# Patient Record
Sex: Male | Born: 1985 | Race: Black or African American | Hispanic: No | Marital: Single | Smoking: Never smoker
Health system: Southern US, Community
[De-identification: ages and names within clinical notes are randomized; demographics above are authoritative.]

## PROBLEM LIST (undated history)

## (undated) DIAGNOSIS — E7800 Pure hypercholesterolemia, unspecified: Secondary | ICD-10-CM

## (undated) DIAGNOSIS — W3400XA Accidental discharge from unspecified firearms or gun, initial encounter: Secondary | ICD-10-CM

## (undated) DIAGNOSIS — F419 Anxiety disorder, unspecified: Secondary | ICD-10-CM

## (undated) DIAGNOSIS — F17200 Nicotine dependence, unspecified, uncomplicated: Secondary | ICD-10-CM

## (undated) DIAGNOSIS — T7840XA Allergy, unspecified, initial encounter: Secondary | ICD-10-CM

## (undated) HISTORY — DX: Nicotine dependence, unspecified, uncomplicated: F17.200

## (undated) HISTORY — DX: Allergy, unspecified, initial encounter: T78.40XA

## (undated) HISTORY — PX: LEG SURGERY: SHX1003

---
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
## 2010-08-30 DIAGNOSIS — R109 Unspecified abdominal pain: Secondary | ICD-10-CM | POA: Insufficient documentation

## 2010-10-06 DIAGNOSIS — R0602 Shortness of breath: Secondary | ICD-10-CM | POA: Insufficient documentation

## 2010-10-06 DIAGNOSIS — R197 Diarrhea, unspecified: Secondary | ICD-10-CM | POA: Insufficient documentation

## 2010-10-06 DIAGNOSIS — R079 Chest pain, unspecified: Secondary | ICD-10-CM | POA: Insufficient documentation

## 2010-10-06 DIAGNOSIS — R634 Abnormal weight loss: Secondary | ICD-10-CM | POA: Insufficient documentation

## 2010-10-06 DIAGNOSIS — R630 Anorexia: Secondary | ICD-10-CM | POA: Insufficient documentation

## 2010-10-06 DIAGNOSIS — R509 Fever, unspecified: Secondary | ICD-10-CM | POA: Insufficient documentation

## 2010-10-06 DIAGNOSIS — R109 Unspecified abdominal pain: Secondary | ICD-10-CM | POA: Insufficient documentation

## 2010-10-06 DIAGNOSIS — R112 Nausea with vomiting, unspecified: Secondary | ICD-10-CM | POA: Insufficient documentation

## 2014-07-11 ENCOUNTER — Emergency Department (HOSPITAL_COMMUNITY)
Admission: EM | Admit: 2014-07-11 | Discharge: 2014-07-11 | Discharge status: Against Medical Advice | Payer: Medicaid Other | Attending: Emergency Medicine | Admitting: Emergency Medicine

## 2014-07-11 DIAGNOSIS — R5383 Other fatigue: Secondary | ICD-10-CM | POA: Diagnosis present
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
## 2014-07-11 NOTE — ED Notes (Signed)
Pt called to triage x3, no answer.

## 2014-07-11 NOTE — ED Notes (Addendum)
Pt called for triage x1! No answer °
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
## 2014-07-11 NOTE — ED Notes (Signed)
Pt called for triage x2, no answer. 

## 2017-03-09 ENCOUNTER — Emergency Department (HOSPITAL_COMMUNITY): Payer: Medicaid Other

## 2017-03-09 ENCOUNTER — Inpatient Hospital Stay (HOSPITAL_COMMUNITY)
Admission: EM | Admit: 2017-03-09 | Discharge: 2017-03-11 | DRG: 176 | Disposition: A | Discharge status: Home / Self Care | Payer: Medicaid Other | Attending: Family Medicine | Admitting: Family Medicine

## 2017-03-09 ENCOUNTER — Encounter (HOSPITAL_COMMUNITY): Payer: Self-pay | Admitting: Emergency Medicine

## 2017-03-09 DIAGNOSIS — N179 Acute kidney failure, unspecified: Secondary | ICD-10-CM | POA: Diagnosis present

## 2017-03-09 DIAGNOSIS — F1410 Cocaine abuse, uncomplicated: Secondary | ICD-10-CM | POA: Diagnosis present

## 2017-03-09 DIAGNOSIS — R042 Hemoptysis: Secondary | ICD-10-CM

## 2017-03-09 DIAGNOSIS — I2699 Other pulmonary embolism without acute cor pulmonale: Principal | ICD-10-CM | POA: Diagnosis present

## 2017-03-09 DIAGNOSIS — W3301XD Accidental discharge of shotgun, subsequent encounter: Secondary | ICD-10-CM

## 2017-03-09 DIAGNOSIS — F1090 Alcohol use, unspecified, uncomplicated: Secondary | ICD-10-CM

## 2017-03-09 DIAGNOSIS — S71131D Puncture wound without foreign body, right thigh, subsequent encounter: Secondary | ICD-10-CM

## 2017-03-09 DIAGNOSIS — Z7289 Other problems related to lifestyle: Secondary | ICD-10-CM

## 2017-03-09 DIAGNOSIS — F329 Major depressive disorder, single episode, unspecified: Secondary | ICD-10-CM | POA: Diagnosis present

## 2017-03-09 DIAGNOSIS — F17200 Nicotine dependence, unspecified, uncomplicated: Secondary | ICD-10-CM

## 2017-03-09 DIAGNOSIS — F1210 Cannabis abuse, uncomplicated: Secondary | ICD-10-CM | POA: Diagnosis present

## 2017-03-09 DIAGNOSIS — F17210 Nicotine dependence, cigarettes, uncomplicated: Secondary | ICD-10-CM | POA: Diagnosis present

## 2017-03-09 DIAGNOSIS — F1490 Cocaine use, unspecified, uncomplicated: Secondary | ICD-10-CM

## 2017-03-09 DIAGNOSIS — Z789 Other specified health status: Secondary | ICD-10-CM

## 2017-03-09 DIAGNOSIS — M21371 Foot drop, right foot: Secondary | ICD-10-CM | POA: Diagnosis present

## 2017-03-09 HISTORY — DX: Accidental discharge from unspecified firearms or gun, initial encounter: W34.00XA

## 2017-03-09 LAB — I-STAT TROPONIN, ED: Troponin i, poc: 0 ng/mL (ref 0.00–0.08)

## 2017-03-09 LAB — RAPID URINE DRUG SCREEN, HOSP PERFORMED
AMPHETAMINES: NOT DETECTED
BENZODIAZEPINES: POSITIVE — AB
Barbiturates: NOT DETECTED
Cocaine: POSITIVE — AB
Opiates: NOT DETECTED
Tetrahydrocannabinol: POSITIVE — AB

## 2017-03-09 LAB — CBC
HEMATOCRIT: 43.4 % (ref 39.0–52.0)
Hemoglobin: 15.7 g/dL (ref 13.0–17.0)
MCH: 29.2 pg (ref 26.0–34.0)
MCHC: 36.2 g/dL — AB (ref 30.0–36.0)
MCV: 80.8 fL (ref 78.0–100.0)
Platelets: 303 10*3/uL (ref 150–400)
RBC: 5.37 MIL/uL (ref 4.22–5.81)
RDW: 13.5 % (ref 11.5–15.5)
WBC: 11.1 10*3/uL — ABNORMAL HIGH (ref 4.0–10.5)

## 2017-03-09 LAB — BASIC METABOLIC PANEL
ANION GAP: 9 (ref 5–15)
BUN: 5 mg/dL — ABNORMAL LOW (ref 6–20)
CHLORIDE: 101 mmol/L (ref 101–111)
CO2: 26 mmol/L (ref 22–32)
Calcium: 9.7 mg/dL (ref 8.9–10.3)
Creatinine, Ser: 1.3 mg/dL — ABNORMAL HIGH (ref 0.61–1.24)
GFR calc Af Amer: >60 mL/min (ref >60)
GLUCOSE: 116 mg/dL — AB (ref 65–99)
POTASSIUM: 3.9 mmol/L (ref 3.5–5.1)
Sodium: 136 mmol/L (ref 135–145)

## 2017-03-09 LAB — ETHANOL

## 2017-03-09 MED ORDER — ENOXAPARIN SODIUM 80 MG/0.8ML ~~LOC~~ SOLN
1.0000 mg/kg | Freq: Two times a day (BID) | SUBCUTANEOUS | Status: DC
  Administered 2017-03-09 – 2017-03-10 (×2): 75 mg via SUBCUTANEOUS
  Filled 2017-03-09 (×3): qty 0.8

## 2017-03-09 MED ORDER — VITAMIN B-1 100 MG PO TABS
100.0000 mg | ORAL_TABLET | Freq: Every day | ORAL | Status: DC
  Administered 2017-03-09 – 2017-03-11 (×3): 100 mg via ORAL
  Filled 2017-03-09 (×3): qty 1

## 2017-03-09 MED ORDER — NICOTINE 21 MG/24HR TD PT24
21.0000 mg | MEDICATED_PATCH | Freq: Every day | TRANSDERMAL | Status: DC
  Administered 2017-03-09 – 2017-03-11 (×3): 21 mg via TRANSDERMAL
  Filled 2017-03-09 (×3): qty 1

## 2017-03-09 MED ORDER — KETOROLAC TROMETHAMINE 30 MG/ML IJ SOLN
30.0000 mg | Freq: Four times a day (QID) | INTRAMUSCULAR | Status: DC | PRN
Start: 2017-03-09 — End: 2017-03-11
  Administered 2017-03-10: 30 mg via INTRAVENOUS
  Filled 2017-03-09: qty 1

## 2017-03-09 MED ORDER — POLYETHYLENE GLYCOL 3350 17 G PO PACK: 17.0000 g | PACK | Freq: Every day | ORAL | Status: DC | PRN

## 2017-03-09 MED ORDER — SODIUM CHLORIDE 0.9% FLUSH: 3.0000 mL | INTRAVENOUS | Status: DC | PRN

## 2017-03-09 MED ORDER — FENTANYL CITRATE (PF) 100 MCG/2ML IJ SOLN: 50.0000 ug | Freq: Once | INTRAMUSCULAR | Status: DC

## 2017-03-09 MED ORDER — IOPAMIDOL (ISOVUE-370) INJECTION 76%
INTRAVENOUS | Status: AC
  Filled 2017-03-09: qty 100

## 2017-03-09 MED ORDER — SODIUM CHLORIDE 0.9% FLUSH
3.0000 mL | Freq: Two times a day (BID) | INTRAVENOUS | Status: DC
  Administered 2017-03-09 – 2017-03-11 (×4): 3 mL via INTRAVENOUS

## 2017-03-09 MED ORDER — SODIUM CHLORIDE 0.9 % IV SOLN: 250.0000 mL | INTRAVENOUS | Status: DC | PRN

## 2017-03-09 MED ORDER — ONDANSETRON HCL 4 MG PO TABS: 4.0000 mg | ORAL_TABLET | Freq: Four times a day (QID) | ORAL | Status: DC | PRN

## 2017-03-09 MED ORDER — OXYCODONE-ACETAMINOPHEN 5-325 MG PO TABS
2.0000 | ORAL_TABLET | ORAL | Status: DC | PRN
  Administered 2017-03-09: 2 via ORAL
  Filled 2017-03-09: qty 2

## 2017-03-09 MED ORDER — SODIUM CHLORIDE 0.9 % IV BOLUS (SEPSIS)
1000.0000 mL | Freq: Once | INTRAVENOUS | Status: AC
  Administered 2017-03-09: 1000 mL via INTRAVENOUS

## 2017-03-09 MED ORDER — ONDANSETRON HCL 4 MG/2ML IJ SOLN: 4.0000 mg | Freq: Four times a day (QID) | INTRAMUSCULAR | Status: DC | PRN

## 2017-03-09 MED ORDER — FOLIC ACID 1 MG PO TABS
1.0000 mg | ORAL_TABLET | Freq: Every day | ORAL | Status: DC
  Administered 2017-03-09 – 2017-03-11 (×3): 1 mg via ORAL
  Filled 2017-03-09 (×3): qty 1

## 2017-03-09 NOTE — ED Notes (Addendum)
Admitting provider asked that UDS be completed prior to pain medication administration.

## 2017-03-09 NOTE — ED Notes (Signed)
Patient transported to X-ray 
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
## 2017-03-09 NOTE — ED Triage Notes (Signed)
Patient reports SOB with chest tightness radiating to mid back onset this week .

## 2017-03-09 NOTE — ED Notes (Signed)
Patient transported to CT 
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
## 2017-03-10 ENCOUNTER — Observation Stay (HOSPITAL_BASED_OUTPATIENT_CLINIC_OR_DEPARTMENT_OTHER): Payer: Medicaid Other

## 2017-03-10 ENCOUNTER — Encounter (HOSPITAL_COMMUNITY): Payer: Self-pay | Admitting: *Deleted

## 2017-03-10 DIAGNOSIS — F1090 Alcohol use, unspecified, uncomplicated: Secondary | ICD-10-CM

## 2017-03-10 DIAGNOSIS — F17200 Nicotine dependence, unspecified, uncomplicated: Secondary | ICD-10-CM

## 2017-03-10 DIAGNOSIS — R042 Hemoptysis: Secondary | ICD-10-CM

## 2017-03-10 DIAGNOSIS — F1490 Cocaine use, unspecified, uncomplicated: Secondary | ICD-10-CM | POA: Diagnosis not present

## 2017-03-10 DIAGNOSIS — Z7289 Other problems related to lifestyle: Secondary | ICD-10-CM

## 2017-03-10 DIAGNOSIS — I2699 Other pulmonary embolism without acute cor pulmonale: Secondary | ICD-10-CM | POA: Diagnosis not present

## 2017-03-10 DIAGNOSIS — I361 Nonrheumatic tricuspid (valve) insufficiency: Secondary | ICD-10-CM

## 2017-03-10 DIAGNOSIS — Z789 Other specified health status: Secondary | ICD-10-CM | POA: Diagnosis not present

## 2017-03-10 LAB — BASIC METABOLIC PANEL
Anion gap: 10 (ref 5–15)
Anion gap: 9 (ref 5–15)
BUN: 5 mg/dL — AB (ref 6–20)
BUN: 7 mg/dL (ref 6–20)
CALCIUM: 9.3 mg/dL (ref 8.9–10.3)
CALCIUM: 8.9 mg/dL (ref 8.9–10.3)
CHLORIDE: 99 mmol/L — AB (ref 101–111)
CO2: 27 mmol/L (ref 22–32)
CO2: 26 mmol/L (ref 22–32)
CREATININE: 1.21 mg/dL (ref 0.61–1.24)
CREATININE: 1.26 mg/dL — AB (ref 0.61–1.24)
Chloride: 98 mmol/L — ABNORMAL LOW (ref 101–111)
GFR calc Af Amer: >60 mL/min (ref >60)
GFR calc non Af Amer: >60 mL/min (ref >60)
GFR calc non Af Amer: >60 mL/min (ref >60)
Glucose, Bld: 96 mg/dL (ref 65–99)
Glucose, Bld: 131 mg/dL — ABNORMAL HIGH (ref 65–99)
Potassium: 4 mmol/L (ref 3.5–5.1)
Potassium: 3.6 mmol/L (ref 3.5–5.1)
SODIUM: 133 mmol/L — AB (ref 135–145)
Sodium: 136 mmol/L (ref 135–145)

## 2017-03-10 LAB — ECHOCARDIOGRAM COMPLETE
Height: 72 in
WEIGHTICAEL: 2657.6 [oz_av]

## 2017-03-10 LAB — CBC
HCT: 42 % (ref 39.0–52.0)
Hemoglobin: 14.5 g/dL (ref 13.0–17.0)
MCH: 28.4 pg (ref 26.0–34.0)
MCHC: 34.5 g/dL (ref 30.0–36.0)
MCV: 82.4 fL (ref 78.0–100.0)
PLATELETS: 326 10*3/uL (ref 150–400)
RBC: 5.1 MIL/uL (ref 4.22–5.81)
RDW: 13.6 % (ref 11.5–15.5)
WBC: 11.9 10*3/uL — ABNORMAL HIGH (ref 4.0–10.5)

## 2017-03-10 LAB — HIV ANTIBODY (ROUTINE TESTING W REFLEX): HIV Screen 4th Generation wRfx: NONREACTIVE

## 2017-03-10 MED ORDER — RIVAROXABAN 15 MG PO TABS
15.0000 mg | ORAL_TABLET | Freq: Two times a day (BID) | ORAL | Status: DC
  Administered 2017-03-10 – 2017-03-11 (×2): 15 mg via ORAL
  Filled 2017-03-10 (×2): qty 1

## 2017-03-10 MED ORDER — ACETAMINOPHEN 325 MG PO TABS
650.0000 mg | ORAL_TABLET | Freq: Four times a day (QID) | ORAL | Status: DC | PRN
  Administered 2017-03-10: 650 mg via ORAL
  Filled 2017-03-10: qty 2
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
## 2017-03-10 NOTE — Progress Notes (Signed)
Patient coughed up blood, about a quarter size in a cup of water. Will continue to monitor.
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
## 2017-03-10 NOTE — Progress Notes (Signed)
Patient doing well. Tolerated physical and occupational therapy. Visitors at the bedside at this time.
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
## 2017-03-11 DIAGNOSIS — I2699 Other pulmonary embolism without acute cor pulmonale: Secondary | ICD-10-CM | POA: Diagnosis present

## 2017-03-11 DIAGNOSIS — W3301XD Accidental discharge of shotgun, subsequent encounter: Secondary | ICD-10-CM | POA: Diagnosis not present

## 2017-03-11 DIAGNOSIS — N179 Acute kidney failure, unspecified: Secondary | ICD-10-CM | POA: Diagnosis present

## 2017-03-11 DIAGNOSIS — R042 Hemoptysis: Secondary | ICD-10-CM | POA: Diagnosis present

## 2017-03-11 DIAGNOSIS — F1210 Cannabis abuse, uncomplicated: Secondary | ICD-10-CM | POA: Diagnosis present

## 2017-03-11 DIAGNOSIS — F17200 Nicotine dependence, unspecified, uncomplicated: Secondary | ICD-10-CM | POA: Diagnosis not present

## 2017-03-11 DIAGNOSIS — R0602 Shortness of breath: Secondary | ICD-10-CM | POA: Diagnosis present

## 2017-03-11 DIAGNOSIS — F17210 Nicotine dependence, cigarettes, uncomplicated: Secondary | ICD-10-CM | POA: Diagnosis present

## 2017-03-11 DIAGNOSIS — M21371 Foot drop, right foot: Secondary | ICD-10-CM | POA: Diagnosis present

## 2017-03-11 DIAGNOSIS — F1410 Cocaine abuse, uncomplicated: Secondary | ICD-10-CM | POA: Diagnosis present

## 2017-03-11 DIAGNOSIS — F329 Major depressive disorder, single episode, unspecified: Secondary | ICD-10-CM | POA: Diagnosis present

## 2017-03-11 DIAGNOSIS — S71131D Puncture wound without foreign body, right thigh, subsequent encounter: Secondary | ICD-10-CM | POA: Diagnosis not present

## 2017-03-11 HISTORY — DX: Other pulmonary embolism without acute cor pulmonale: I26.99

## 2017-03-11 LAB — CBC
HCT: 37.6 % — ABNORMAL LOW (ref 39.0–52.0)
HEMOGLOBIN: 12.9 g/dL — AB (ref 13.0–17.0)
MCH: 27.7 pg (ref 26.0–34.0)
MCHC: 34.3 g/dL (ref 30.0–36.0)
MCV: 80.7 fL (ref 78.0–100.0)
Platelets: 308 10*3/uL (ref 150–400)
RBC: 4.66 MIL/uL (ref 4.22–5.81)
RDW: 13.2 % (ref 11.5–15.5)
WBC: 9.5 10*3/uL (ref 4.0–10.5)

## 2017-03-11 MED ORDER — FLUOXETINE HCL 20 MG PO CAPS: 20.0000 mg | ORAL_CAPSULE | Freq: Every day | ORAL | 0 refills | Status: DC

## 2017-03-11 MED ORDER — RIVAROXABAN 15 MG PO TABS: 15.0000 mg | ORAL_TABLET | Freq: Two times a day (BID) | ORAL | 0 refills | Status: DC

## 2017-03-11 MED ORDER — FLUOXETINE HCL 20 MG PO CAPS
20.0000 mg | ORAL_CAPSULE | Freq: Every day | ORAL | Status: DC
  Administered 2017-03-11: 20 mg via ORAL
  Filled 2017-03-11: qty 1

## 2017-03-11 MED ORDER — NICOTINE 21 MG/24HR TD PT24: 21.0000 mg | MEDICATED_PATCH | Freq: Every day | TRANSDERMAL | 0 refills | Status: DC

## 2017-03-11 NOTE — Care Management Note (Signed)
Case Management Note Donn Pierini RN, BSN Unit 4E-Case Manager-- 3E coverage [phone]  Patient Details  Name: Martin Nunez MRN: [id number] Date of Birth: [birth year]  Subjective/Objective:  Pt presented with PE                  Action/Plan: PTA pt lived at home- referral for Xarelto and HH needs- CM spoke with pt at bedside- 30 day free card for Xarelto given to pt to use on discharge with instructions on where to go get starter pack filled ([facility])- Xarelto is on Medicaid preferred list and a covered drug. - Pt has crutches at home- and transportation- HHPT ordered however pt does not have a qualifying dx under medicaid for HHPT to be covered- explained this to pt and that HHPT would have to be an out of pocket expense - pt has declined HH services due to cost- can not afford. No HH referral made  Expected Discharge Date:  03/11/17               Expected Discharge Plan:  Home w Home Health Services  In-House Referral:     Discharge planning Services  CM Consult, Medication Assistance  Post Acute Care Choice:  Home Health Choice offered to:  Patient  DME Arranged:    DME Agency:     HH Arranged:  Patient Refused HH HH Agency:     Status of Service:  Completed, signed off  If discussed at [employer] of Stay Meetings, dates discussed:    Discharge Disposition: home/self care   Additional Comments:  Darrold Span, RN 03/11/2017, 12:24 PM
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
## 2017-03-11 NOTE — Discharge Instructions (Signed)
Martin Nunez,  Continue xarelto for treatment of your pulmonary embolism. You are on day 2 of 21 days on 15 mg twice daily. On 9/9, your dose will change to 20 mg daily. This prescription will be provided at your follow-up.   Orders were placed for you to have home health PT.  You were started on prozac 20 mg for mood. Please stop this if you notice your mood worsening.  Thank you for letting us take part in your care.  Information on my medicine - XARELTO (rivaroxaban)  This medication education was reviewed with me or my healthcare representative as part of my discharge preparation.  The pharmacist that spoke with me during my hospital stay was:  Toniann Fail Rudisill, RPH  WHY WAS XARELTO PRESCRIBED FOR YOU? Xarelto was prescribed to treat blood clots that may have been found in the veins of your legs (deep vein thrombosis) or in your lungs (pulmonary embolism) and to reduce the risk of them occurring again.  What do you need to know about Xarelto? The starting dose is one 15 mg tablet taken TWICE daily with food for the FIRST 21 DAYS then on 04/01/17 the dose is changed to one 20 mg tablet taken ONCE A DAY with your evening meal.  DO NOT stop taking Xarelto without talking to the health care provider who prescribed the medication.  Refill your prescription for 20 mg tablets before you run out.  After discharge, you should have regular check-up appointments with your healthcare provider that is prescribing your Xarelto.  In the future your dose may need to be changed if your kidney function changes by a significant amount.  What do you do if you miss a dose? If you are taking Xarelto TWICE DAILY and you miss a dose, take it as soon as you remember. You may take two 15 mg tablets (total 30 mg) at the same time then resume your regularly scheduled 15 mg twice daily the next day.  If you are taking Xarelto ONCE DAILY and you miss a dose, take it as soon as you remember on the same day then  continue your regularly scheduled once daily regimen the next day. Do not take two doses of Xarelto at the same time.   Important Safety Information Xarelto is a blood thinner medicine that can cause bleeding. You should call your healthcare provider right away if you experience any of the following: ? Bleeding from an injury or your nose that does not stop. ? Unusual colored urine (red or dark Crymes) or unusual colored stools (red or black). ? Unusual bruising for unknown reasons. ? A serious fall or if you hit your head (even if there is no bleeding).  Some medicines may interact with Xarelto and might increase your risk of bleeding while on Xarelto. To help avoid this, consult your healthcare provider or pharmacist prior to using any new prescription or non-prescription medications, including herbals, vitamins, non-steroidal anti-inflammatory drugs (NSAIDs) and supplements.  This website has more information on Xarelto: VisitDestination.com.br.

## 2017-03-11 NOTE — Progress Notes (Signed)
Patient given discharge instructions and all questions answered.  
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
## 2017-03-23 ENCOUNTER — Encounter: Payer: Self-pay | Admitting: Family Medicine

## 2017-03-23 ENCOUNTER — Ambulatory Visit (INDEPENDENT_AMBULATORY_CARE_PROVIDER_SITE_OTHER): Payer: Medicaid Other | Admitting: Family Medicine

## 2017-03-23 VITALS — BP 130/92 | HR 75 | Temp 98.2°F | Ht 72.0 in | Wt 177.0 lb

## 2017-03-23 DIAGNOSIS — F17200 Nicotine dependence, unspecified, uncomplicated: Secondary | ICD-10-CM

## 2017-03-23 DIAGNOSIS — M21371 Foot drop, right foot: Secondary | ICD-10-CM | POA: Insufficient documentation

## 2017-03-23 DIAGNOSIS — I2699 Other pulmonary embolism without acute cor pulmonale: Secondary | ICD-10-CM

## 2017-03-23 DIAGNOSIS — S71101D Unspecified open wound, right thigh, subsequent encounter: Secondary | ICD-10-CM | POA: Diagnosis not present

## 2017-03-23 DIAGNOSIS — W3400XD Accidental discharge from unspecified firearms or gun, subsequent encounter: Secondary | ICD-10-CM | POA: Insufficient documentation

## 2017-03-23 DIAGNOSIS — S71131D Puncture wound without foreign body, right thigh, subsequent encounter: Secondary | ICD-10-CM | POA: Insufficient documentation

## 2017-03-23 HISTORY — DX: Puncture wound without foreign body, right thigh, subsequent encounter: S71.131D

## 2017-03-23 MED ORDER — FLUOXETINE HCL 40 MG PO CAPS: 40.0000 mg | ORAL_CAPSULE | Freq: Every day | ORAL | 3 refills | Status: DC

## 2017-03-23 MED ORDER — GABAPENTIN 100 MG PO CAPS: 100.0000 mg | ORAL_CAPSULE | Freq: Three times a day (TID) | ORAL | 3 refills | Status: DC

## 2017-03-23 MED ORDER — NICOTINE 14 MG/24HR TD PT24: 14.0000 mg | MEDICATED_PATCH | Freq: Every day | TRANSDERMAL | 3 refills | Status: DC

## 2017-03-23 MED ORDER — RIVAROXABAN 20 MG PO TABS: ORAL_TABLET | ORAL | 1 refills | Status: DC

## 2017-03-23 NOTE — Assessment & Plan Note (Addendum)
Provoked. Symptoms nearly resolved. Patient is on day 13 of his Xarelto 15 mg BID. I have written for him to start 20 mg daily starting 04/02/2017. He will continue this until he returns to clinic in 3 months for follow up to discuss his stop date.

## 2017-03-23 NOTE — Assessment & Plan Note (Signed)
Wound healing well. He will be attending PT.
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
## 2017-03-23 NOTE — Assessment & Plan Note (Signed)
Patient left hospital with nicotine patch 21 mg/24 hours. Reports he has not smoked since he was discharged and he does not use the patch every day. I have decreased the dosage to 14 mg/24 hr.

## 2017-03-23 NOTE — Patient Instructions (Addendum)
Thank you for coming to see me today. It was a pleasure. Today we talked about:   - Continuing your Xarelto 15 mg two times a day until 04/01/2017. On 04/02/2017, you will switch to 20 mg once per day. Continue this until your next follow up to prevent any more clots. - Increasing your Prozac to 40 mg a day. - [facility]. - I will start you on Gabapentin for your nerve pain in your foot.   Please follow-up with this clinic in 3 months.  If you have any questions or concerns, please do not hesitate to call the office at [phone].  Take Care,   [place] Shenise Wolgamott, DO

## 2017-03-24 LAB — CBC
HEMATOCRIT: 43.8 % (ref 37.5–51.0)
Hemoglobin: 14.5 g/dL (ref 13.0–17.7)
MCH: 27.6 pg (ref 26.6–33.0)
MCHC: 33.1 g/dL (ref 31.5–35.7)
MCV: 83 fL (ref 79–97)
PLATELETS: 451 10*3/uL — AB (ref 150–379)
RBC: 5.26 x10E6/uL (ref 4.14–5.80)
RDW: 14.9 % (ref 12.3–15.4)
WBC: 8.8 10*3/uL (ref 3.4–10.8)

## 2017-03-24 LAB — BASIC METABOLIC PANEL
BUN/Creatinine Ratio: 8 — ABNORMAL LOW (ref 9–20)
BUN: 10 mg/dL (ref 6–20)
CALCIUM: 10 mg/dL (ref 8.7–10.2)
CHLORIDE: 100 mmol/L (ref 96–106)
CO2: 23 mmol/L (ref 20–29)
Creatinine, Ser: 1.28 mg/dL — ABNORMAL HIGH (ref 0.76–1.27)
GFR, EST AFRICAN AMERICAN: 84 mL/min/{1.73_m2} (ref >59)
GFR, EST NON AFRICAN AMERICAN: 73 mL/min/{1.73_m2} (ref >59)
Glucose: 90 mg/dL (ref 65–99)
POTASSIUM: 4.3 mmol/L (ref 3.5–5.2)
Sodium: 141 mmol/L (ref 134–144)

## 2017-03-29 ENCOUNTER — Telehealth: Payer: Self-pay | Admitting: Family Medicine

## 2017-03-29 NOTE — Telephone Encounter (Signed)
Patient called about his recent labs, will follow up on his BMP when he returns in 3 months. Reports he is doing much better.

## 2017-04-02 ENCOUNTER — Telehealth: Payer: Self-pay | Admitting: General Practice

## 2017-04-02 NOTE — Telephone Encounter (Signed)
Von from [facility] called stating that pt. Scored a 10 on the PHQ9.  Rep also stated [facility] and she was informed That pt. Has never been seen here and would have to call 10/1 to schedule A new pt. Appt.

## 2019-03-29 IMAGING — CT CT ANGIO CHEST
2 of 7 series · 19 of 46 positions shown · IV contrast (isovue)
Comparison: None.

CLINICAL DATA: Right-sided chest pain for 2 days

EXAM:
CT ANGIOGRAPHY CHEST WITH CONTRAST
TECHNIQUE: Multidetector CT imaging of the chest was performed using the
standard protocol during bolus administration of intravenous
contrast. Multiplanar CT image reconstructions and MIPs were
obtained to evaluate the vascular anatomy.
CONTRAST:  67 mL Isovue 370.

[Series 7: thins · axial · 0.64mm/px · z∈[+1240,+1488]mm · 16 of 401 slices shown]
[im 23/401  lung]
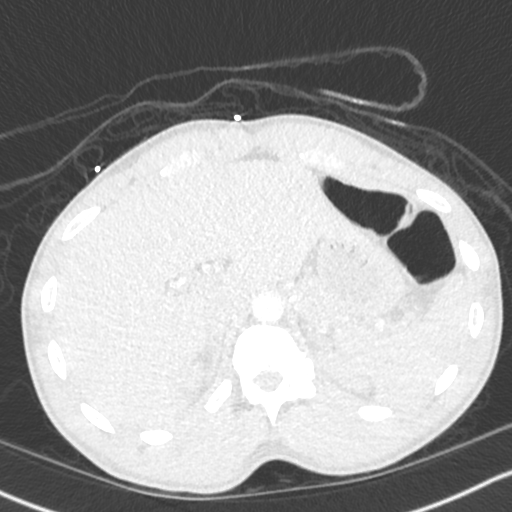
[im 45/401  soft-tissue]
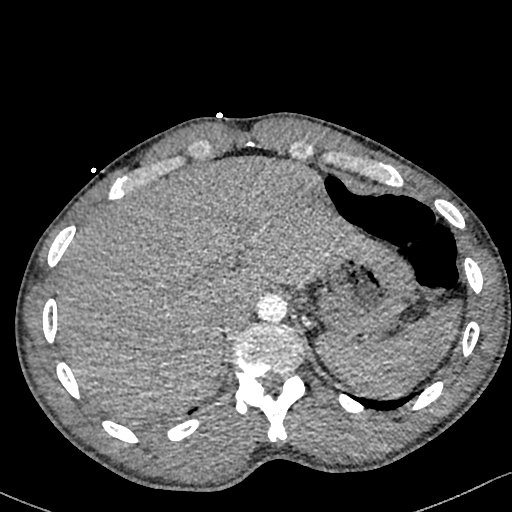
[im 67/401  lung]
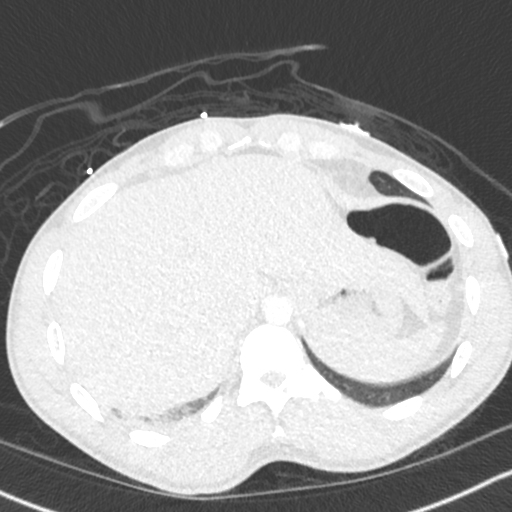
[im 89/401  soft-tissue]
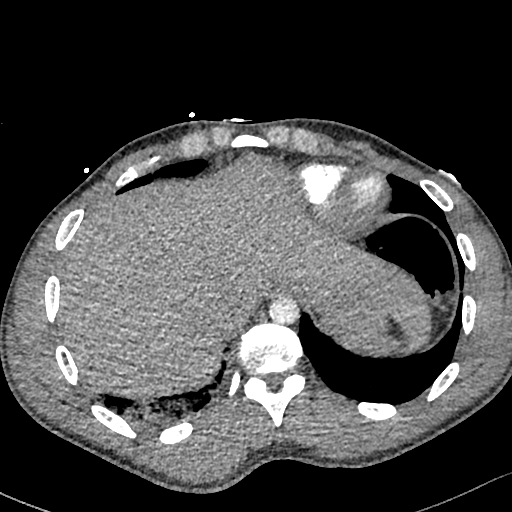
[im 112/401  lung]
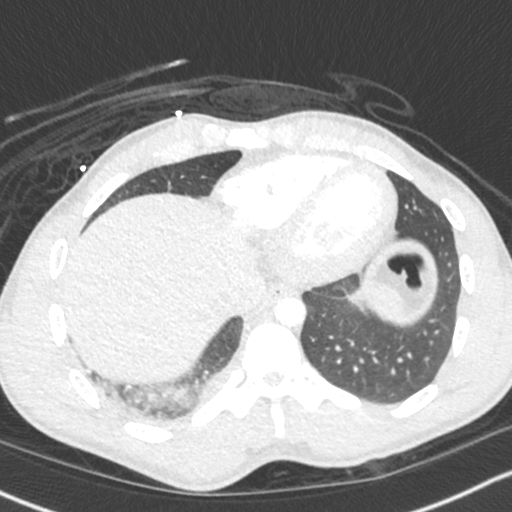
[im 134/401  soft-tissue]
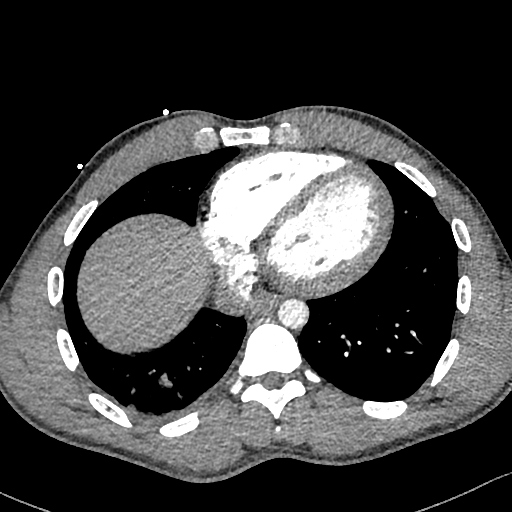
[im 156/401  lung]
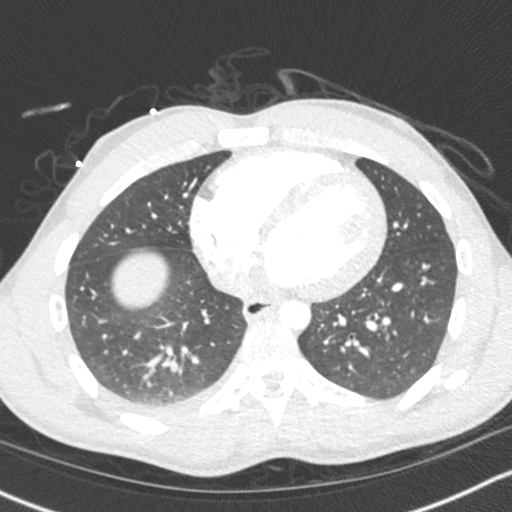
[im 178/401  soft-tissue]
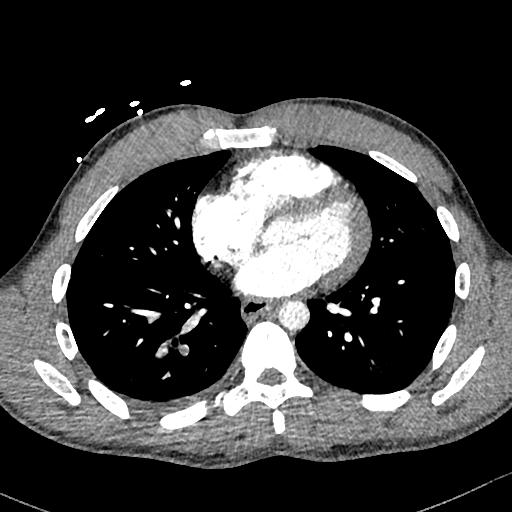
[im 223/401  lung]
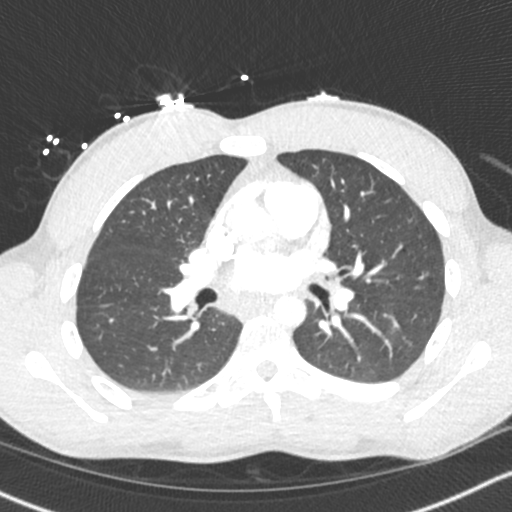
[im 245/401  soft-tissue]
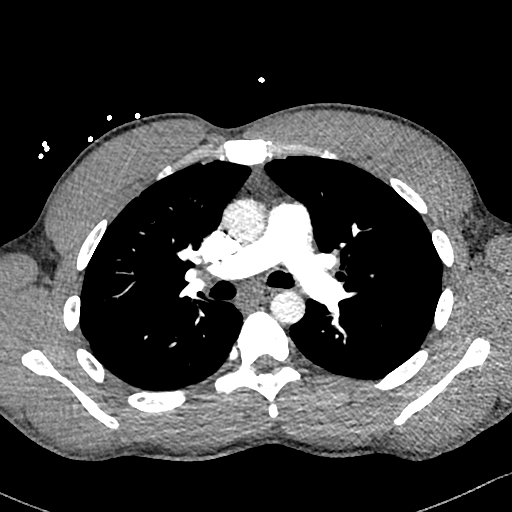
[im 267/401  lung]
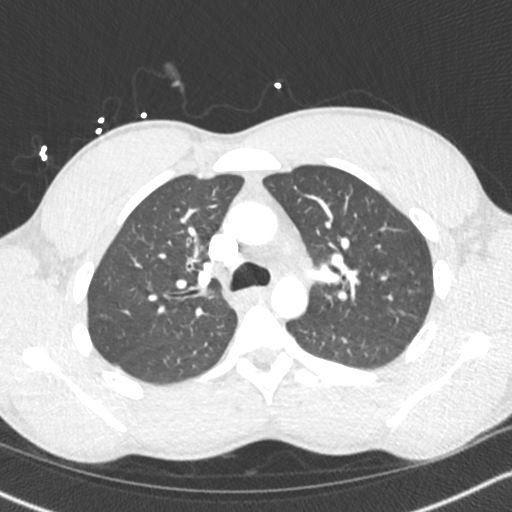
[im 289/401  soft-tissue]
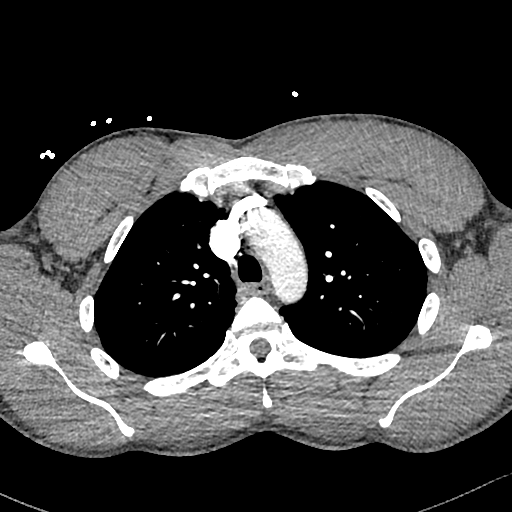
[im 312/401  lung]
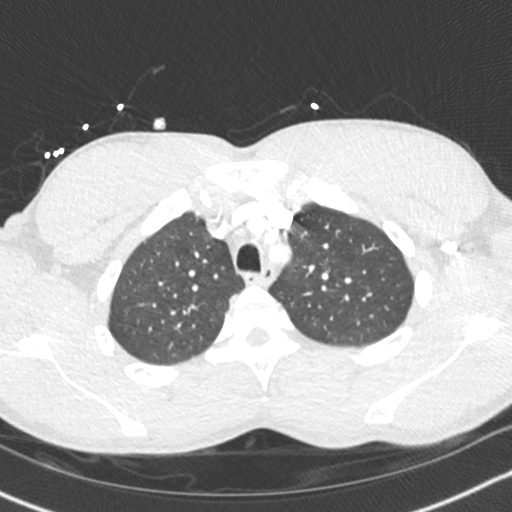
[im 334/401  soft-tissue]
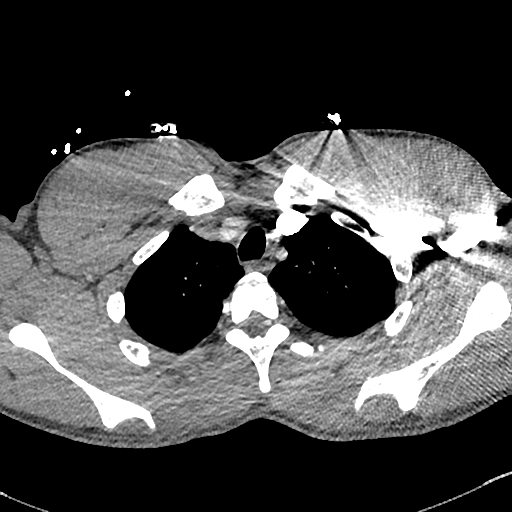
[im 356/401  lung]
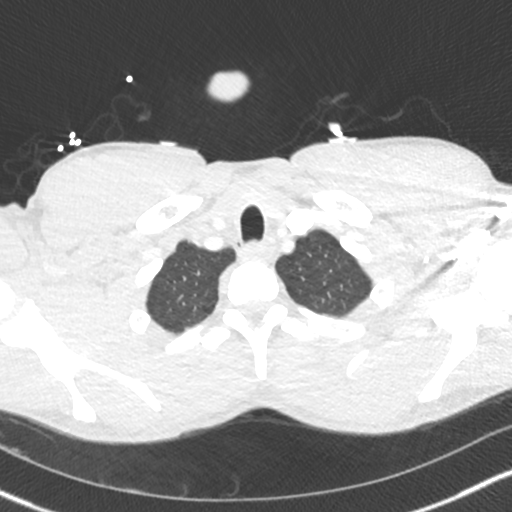
[im 378/401  soft-tissue]
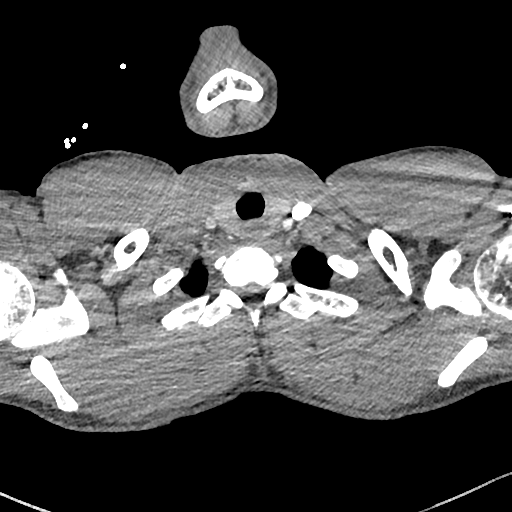

[Series 8: cor · coronal · 0.59mm/px · 3 of 133 slices shown]
[im 34/133  soft-tissue]
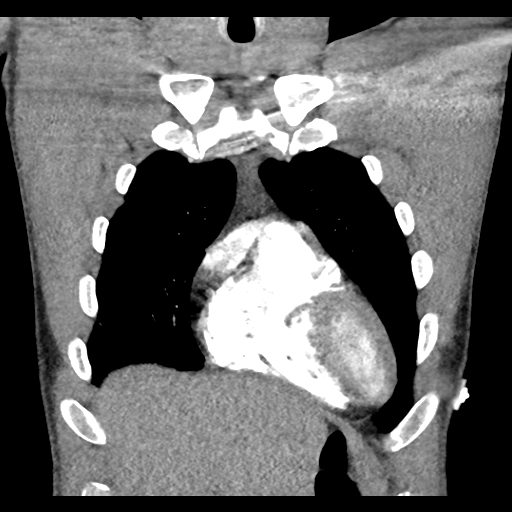
[im 67/133  soft-tissue]
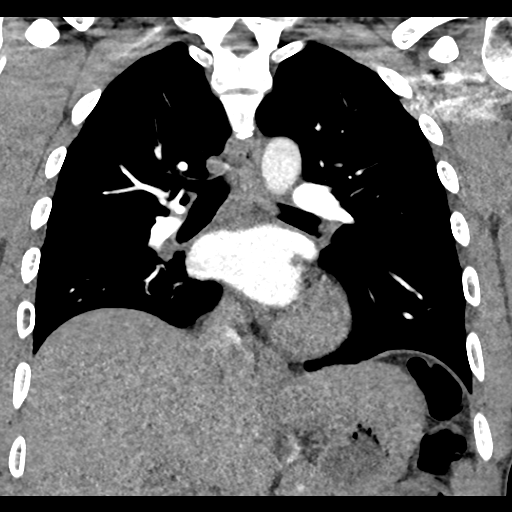
[im 100/133  soft-tissue]
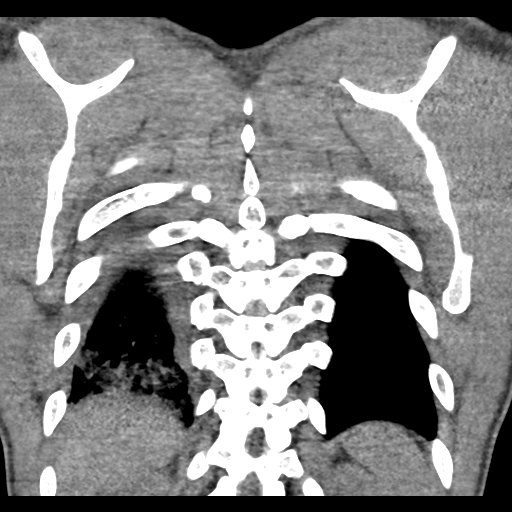

[19 of 46 positions shown; findings below may reference images not displayed]

FINDINGS: Cardiovascular: Thoracic aorta shows a normal branching pattern. No
atherosclerotic calcifications are seen. No aneurysm or dissection
is noted. Cardiac shadow is not significantly enlarged. The
pulmonary artery shows a normal branching pattern with scattered
filling defects within the lower lobes bilaterally but predominately
on the right. No right heart strain is identified.

Mediastinum/Nodes: No significant hilar or mediastinal adenopathy is
noted. The thoracic inlet is within normal limits. The esophagus is
unremarkable.

Lungs/Pleura: Lungs are well aerated bilaterally. Mild right lower
lobe infiltrate is noted consistent with the underlying pulmonary
embolus.

Upper Abdomen: Within normal limits.

Musculoskeletal: Within normal limits.

Review of the MIP images confirms the above findings.
IMPRESSION: Bilateral lower lobe pulmonary emboli worse on the right than the
left with associated mild right lower lobe infiltrate. No evidence
of right heart strain is noted.

Critical Value/emergent results were called by telephone at the time
of interpretation on 03/09/2017 at [DATE] to Dr. PAMELLA RODRIGUEZ LOPEZ ,
who verbally acknowledged these results.
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
## 2019-04-22 DIAGNOSIS — T8189XS Other complications of procedures, not elsewhere classified, sequela: Secondary | ICD-10-CM | POA: Insufficient documentation

## 2019-04-24 ENCOUNTER — Ambulatory Visit: Payer: Self-pay | Admitting: Family Medicine

## 2023-12-25 ENCOUNTER — Ambulatory Visit (HOSPITAL_COMMUNITY)
Admission: RE | Admit: 2023-12-25 | Discharge: 2023-12-25 | Disposition: A | Discharge status: Home / Self Care | Source: Ambulatory Visit

## 2023-12-25 ENCOUNTER — Encounter (HOSPITAL_COMMUNITY): Payer: Self-pay

## 2023-12-25 VITALS — BP 108/58 | HR 68 | Temp 98.1°F | Resp 15

## 2023-12-25 DIAGNOSIS — Z760 Encounter for issue of repeat prescription: Secondary | ICD-10-CM | POA: Insufficient documentation

## 2023-12-25 DIAGNOSIS — Z79899 Other long term (current) drug therapy: Secondary | ICD-10-CM | POA: Insufficient documentation

## 2023-12-25 DIAGNOSIS — E7800 Pure hypercholesterolemia, unspecified: Secondary | ICD-10-CM | POA: Insufficient documentation

## 2023-12-25 HISTORY — DX: Pure hypercholesterolemia, unspecified: E78.00

## 2023-12-25 HISTORY — DX: Anxiety disorder, unspecified: F41.9

## 2023-12-25 LAB — CBC
HCT: 42.8 % (ref 39.0–52.0)
Hemoglobin: 14.6 g/dL (ref 13.0–17.0)
MCH: 28.6 pg (ref 26.0–34.0)
MCHC: 34.1 g/dL (ref 30.0–36.0)
MCV: 83.9 fL (ref 80.0–100.0)
Platelets: 190 10*3/uL (ref 150–400)
RBC: 5.1 MIL/uL (ref 4.22–5.81)
RDW: 12 % (ref 11.5–15.5)
WBC: 5.1 10*3/uL (ref 4.0–10.5)
nRBC: 0 % (ref 0.0–0.2)

## 2023-12-25 LAB — COMPREHENSIVE METABOLIC PANEL WITH GFR
ALT: 36 U/L (ref 0–44)
AST: 49 U/L — ABNORMAL HIGH (ref 15–41)
Albumin: 3.7 g/dL (ref 3.5–5.0)
Alkaline Phosphatase: 42 U/L (ref 38–126)
Anion gap: 11 (ref 5–15)
BUN: 17 mg/dL (ref 6–20)
CO2: 24 mmol/L (ref 22–32)
Calcium: 9.4 mg/dL (ref 8.9–10.3)
Chloride: 105 mmol/L (ref 98–111)
Creatinine, Ser: 1.19 mg/dL (ref 0.61–1.24)
GFR, Estimated: >60 mL/min (ref >60)
Glucose, Bld: 99 mg/dL (ref 70–99)
Potassium: 4.3 mmol/L (ref 3.5–5.1)
Sodium: 140 mmol/L (ref 135–145)
Total Bilirubin: 0.8 mg/dL (ref 0.0–1.2)
Total Protein: 6.6 g/dL (ref 6.5–8.1)

## 2023-12-25 MED ORDER — ROSUVASTATIN CALCIUM 10 MG PO TABS: 10.0000 mg | ORAL_TABLET | Freq: Every day | ORAL | 0 refills | Status: DC

## 2023-12-25 NOTE — Discharge Instructions (Signed)
 I have sent a refill of the blood pressure medication to the pharmacy today.  Please take this as you were previously prescribed.   We are checking blood work today and will contact you with any abnormal results.  If you do not hear from us , that means the blood work is normal.    Please follow up with a primary care provider.  We have helped you make an appointment today.
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
## 2023-12-25 NOTE — ED Triage Notes (Signed)
 Pt reports that he just got out of prison and taking rosuvastatin 10mg  and needing refill and follow up on the medication as well as getting set up with a PCP.

## 2023-12-26 ENCOUNTER — Ambulatory Visit: Payer: Self-pay

## 2024-01-22 ENCOUNTER — Ambulatory Visit (HOSPITAL_BASED_OUTPATIENT_CLINIC_OR_DEPARTMENT_OTHER): Admitting: Family Medicine

## 2024-03-04 ENCOUNTER — Encounter (HOSPITAL_BASED_OUTPATIENT_CLINIC_OR_DEPARTMENT_OTHER): Payer: Self-pay | Admitting: Family Medicine

## 2024-03-04 ENCOUNTER — Ambulatory Visit (HOSPITAL_BASED_OUTPATIENT_CLINIC_OR_DEPARTMENT_OTHER): Admitting: Family Medicine

## 2024-03-04 VITALS — BP 126/81 | HR 62 | Ht 72.0 in | Wt 198.9 lb

## 2024-03-04 DIAGNOSIS — Z8249 Family history of ischemic heart disease and other diseases of the circulatory system: Secondary | ICD-10-CM | POA: Diagnosis not present

## 2024-03-04 DIAGNOSIS — E782 Mixed hyperlipidemia: Secondary | ICD-10-CM | POA: Diagnosis not present

## 2024-03-04 DIAGNOSIS — R7989 Other specified abnormal findings of blood chemistry: Secondary | ICD-10-CM | POA: Diagnosis not present
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
## 2024-03-05 LAB — CBC WITH DIFFERENTIAL/PLATELET
Basophils Absolute: 0 x10E3/uL (ref 0.0–0.2)
Basos: 0 %
EOS (ABSOLUTE): 0.1 x10E3/uL (ref 0.0–0.4)
Eos: 1 %
Hematocrit: 45.2 % (ref 37.5–51.0)
Hemoglobin: 15.1 g/dL (ref 13.0–17.7)
Immature Grans (Abs): 0 x10E3/uL (ref 0.0–0.1)
Immature Granulocytes: 0 %
Lymphocytes Absolute: 2.6 x10E3/uL (ref 0.7–3.1)
Lymphs: 53 %
MCH: 29 pg (ref 26.6–33.0)
MCHC: 33.4 g/dL (ref 31.5–35.7)
MCV: 87 fL (ref 79–97)
Monocytes Absolute: 0.4 x10E3/uL (ref 0.1–0.9)
Monocytes: 8 %
Neutrophils Absolute: 1.9 x10E3/uL (ref 1.4–7.0)
Neutrophils: 38 %
Platelets: 231 x10E3/uL (ref 150–450)
RBC: 5.21 x10E6/uL (ref 4.14–5.80)
RDW: 12.8 % (ref 11.6–15.4)
WBC: 4.9 x10E3/uL (ref 3.4–10.8)

## 2024-03-05 LAB — COMPREHENSIVE METABOLIC PANEL WITH GFR
ALT: 25 IU/L (ref 0–44)
AST: 29 IU/L (ref 0–40)
Albumin: 4.3 g/dL (ref 4.1–5.1)
Alkaline Phosphatase: 58 IU/L (ref 44–121)
BUN/Creatinine Ratio: 14 (ref 9–20)
BUN: 14 mg/dL (ref 6–20)
Bilirubin Total: 0.4 mg/dL (ref 0.0–1.2)
CO2: 22 mmol/L (ref 20–29)
Calcium: 9.5 mg/dL (ref 8.7–10.2)
Chloride: 100 mmol/L (ref 96–106)
Creatinine, Ser: 1.02 mg/dL (ref 0.76–1.27)
Globulin, Total: 2.7 g/dL (ref 1.5–4.5)
Glucose: 95 mg/dL (ref 70–99)
Potassium: 4.4 mmol/L (ref 3.5–5.2)
Sodium: 138 mmol/L (ref 134–144)
Total Protein: 7 g/dL (ref 6.0–8.5)
eGFR: 96 mL/min/1.73 (ref >59)

## 2024-03-05 LAB — C-REACTIVE PROTEIN: CRP: <1 mg/L (ref 0–10)

## 2024-03-05 LAB — LIPID PANEL
Chol/HDL Ratio: 3.5 ratio (ref 0.0–5.0)
Cholesterol, Total: 179 mg/dL (ref 100–199)
HDL: 51 mg/dL (ref >39)
LDL Chol Calc (NIH): 117 mg/dL — ABNORMAL HIGH (ref 0–99)
Triglycerides: 56 mg/dL (ref 0–149)
VLDL Cholesterol Cal: 11 mg/dL (ref 5–40)

## 2024-03-05 LAB — SEDIMENTATION RATE: Sed Rate: 3 mm/h (ref 0–15)

## 2024-03-06 ENCOUNTER — Ambulatory Visit (HOSPITAL_BASED_OUTPATIENT_CLINIC_OR_DEPARTMENT_OTHER): Payer: Self-pay | Admitting: Family Medicine

## 2024-03-06 NOTE — Progress Notes (Signed)
 Hi Martin Nunez,  Your cholesterol is controlled with slight elevation of your LDL cholesterol (goal is less than 100). A heart healthy diet, regular exercise can keep this controlled. I would not recommend statin therapy at this time based upon these levels. Your blood counts were stable and your electrolytes, kidney and liver function is within normal limits. Your inflammatory markers were normal. If you are concerned regarding blood clot history and family history and desire further workup with Hematology, we would be happy to place that for you. Additionally, I can obtain your records from prison to review medical records to determine a possible cause of the blood clot. Please let me know what you would prefer. Thank you

## 2024-05-08 ENCOUNTER — Emergency Department (HOSPITAL_COMMUNITY)

## 2024-05-08 ENCOUNTER — Emergency Department (HOSPITAL_COMMUNITY)
Admission: EM | Admit: 2024-05-08 | Discharge: 2024-05-08 | Disposition: A | Discharge status: Home / Self Care | Attending: Emergency Medicine | Admitting: Emergency Medicine

## 2024-05-08 ENCOUNTER — Encounter (HOSPITAL_COMMUNITY): Payer: Self-pay

## 2024-05-08 ENCOUNTER — Other Ambulatory Visit: Payer: Self-pay

## 2024-05-08 DIAGNOSIS — I959 Hypotension, unspecified: Secondary | ICD-10-CM | POA: Insufficient documentation

## 2024-05-08 DIAGNOSIS — R456 Violent behavior: Secondary | ICD-10-CM | POA: Insufficient documentation

## 2024-05-08 DIAGNOSIS — R4182 Altered mental status, unspecified: Secondary | ICD-10-CM | POA: Insufficient documentation

## 2024-05-08 DIAGNOSIS — F17200 Nicotine dependence, unspecified, uncomplicated: Secondary | ICD-10-CM | POA: Insufficient documentation

## 2024-05-08 DIAGNOSIS — R451 Restlessness and agitation: Secondary | ICD-10-CM | POA: Insufficient documentation

## 2024-05-08 LAB — RAPID URINE DRUG SCREEN, HOSP PERFORMED
Amphetamines: NOT DETECTED
Barbiturates: NOT DETECTED
Benzodiazepines: POSITIVE — AB
Cocaine: NOT DETECTED
Opiates: NOT DETECTED
Tetrahydrocannabinol: NOT DETECTED

## 2024-05-08 LAB — ETHANOL: Alcohol, Ethyl (B): <15 mg/dL (ref <15)

## 2024-05-08 LAB — CBC
HCT: 36.6 % — ABNORMAL LOW (ref 39.0–52.0)
Hemoglobin: 12.7 g/dL — ABNORMAL LOW (ref 13.0–17.0)
MCH: 29.4 pg (ref 26.0–34.0)
MCHC: 34.7 g/dL (ref 30.0–36.0)
MCV: 84.7 fL (ref 80.0–100.0)
Platelets: 154 K/uL (ref 150–400)
RBC: 4.32 MIL/uL (ref 4.22–5.81)
RDW: 12.5 % (ref 11.5–15.5)
WBC: 4.3 K/uL (ref 4.0–10.5)
nRBC: 0 % (ref 0.0–0.2)

## 2024-05-08 LAB — COMPREHENSIVE METABOLIC PANEL WITH GFR
ALT: 25 U/L (ref 0–44)
AST: 30 U/L (ref 15–41)
Albumin: 3.3 g/dL — ABNORMAL LOW (ref 3.5–5.0)
Alkaline Phosphatase: 39 U/L (ref 38–126)
Anion gap: 9 (ref 5–15)
BUN: 13 mg/dL (ref 6–20)
CO2: 26 mmol/L (ref 22–32)
Calcium: 8.7 mg/dL — ABNORMAL LOW (ref 8.9–10.3)
Chloride: 103 mmol/L (ref 98–111)
Creatinine, Ser: 1.22 mg/dL (ref 0.61–1.24)
GFR, Estimated: >60 mL/min (ref >60)
Glucose, Bld: 105 mg/dL — ABNORMAL HIGH (ref 70–99)
Potassium: 3.5 mmol/L (ref 3.5–5.1)
Sodium: 138 mmol/L (ref 135–145)
Total Bilirubin: 1 mg/dL (ref 0.0–1.2)
Total Protein: 5.9 g/dL — ABNORMAL LOW (ref 6.5–8.1)

## 2024-05-08 LAB — TROPONIN I (HIGH SENSITIVITY)
Troponin I (High Sensitivity): 6 ng/L (ref <18)
Troponin I (High Sensitivity): 9 ng/L (ref <18)
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
## 2024-05-08 NOTE — ED Notes (Signed)
 Pt given scrub shirt and discharge paperwork
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
## 2024-05-08 NOTE — Discharge Instructions (Signed)
 The workup was reassuring today.  No clear cause of the feeling bad was found.  Trying to eat a larger breakfast before you work.  Follow-up with your doctor as needed.

## 2024-05-08 NOTE — ED Notes (Signed)
 Explained to patient where he was and what happened, told patient we need urine sample and left urinal on bed for patient to use. Pt still lethargic and have trouble staying awake through conversation

## 2024-05-10 ENCOUNTER — Emergency Department (HOSPITAL_COMMUNITY)
Admission: EM | Admit: 2024-05-10 | Discharge: 2024-05-10 | Disposition: A | Discharge status: Home / Self Care | Attending: Emergency Medicine | Admitting: Emergency Medicine

## 2024-05-10 ENCOUNTER — Emergency Department (HOSPITAL_COMMUNITY)

## 2024-05-10 ENCOUNTER — Other Ambulatory Visit: Payer: Self-pay

## 2024-05-10 DIAGNOSIS — E876 Hypokalemia: Secondary | ICD-10-CM | POA: Diagnosis not present

## 2024-05-10 DIAGNOSIS — F1020 Alcohol dependence, uncomplicated: Secondary | ICD-10-CM | POA: Insufficient documentation

## 2024-05-10 DIAGNOSIS — R112 Nausea with vomiting, unspecified: Secondary | ICD-10-CM | POA: Insufficient documentation

## 2024-05-10 DIAGNOSIS — Y904 Blood alcohol level of 80-99 mg/100 ml: Secondary | ICD-10-CM | POA: Insufficient documentation

## 2024-05-10 LAB — CBC WITH DIFFERENTIAL/PLATELET
Abs Immature Granulocytes: 0.02 K/uL (ref 0.00–0.07)
Basophils Absolute: 0 K/uL (ref 0.0–0.1)
Basophils Relative: 0 %
Eosinophils Absolute: 0 K/uL (ref 0.0–0.5)
Eosinophils Relative: 0 %
HCT: 38.6 % — ABNORMAL LOW (ref 39.0–52.0)
Hemoglobin: 13.5 g/dL (ref 13.0–17.0)
Immature Granulocytes: 0 %
Lymphocytes Relative: 37 %
Lymphs Abs: 2.6 K/uL (ref 0.7–4.0)
MCH: 29.5 pg (ref 26.0–34.0)
MCHC: 35 g/dL (ref 30.0–36.0)
MCV: 84.3 fL (ref 80.0–100.0)
Monocytes Absolute: 0.5 K/uL (ref 0.1–1.0)
Monocytes Relative: 8 %
Neutro Abs: 3.9 K/uL (ref 1.7–7.7)
Neutrophils Relative %: 55 %
Platelets: 203 K/uL (ref 150–400)
RBC: 4.58 MIL/uL (ref 4.22–5.81)
RDW: 12.1 % (ref 11.5–15.5)
WBC: 7.1 K/uL (ref 4.0–10.5)
nRBC: 0 % (ref 0.0–0.2)

## 2024-05-10 LAB — COMPREHENSIVE METABOLIC PANEL WITH GFR
ALT: 35 U/L (ref 0–44)
AST: 54 U/L — ABNORMAL HIGH (ref 15–41)
Albumin: 3.7 g/dL (ref 3.5–5.0)
Alkaline Phosphatase: 40 U/L (ref 38–126)
Anion gap: 14 (ref 5–15)
BUN: 11 mg/dL (ref 6–20)
CO2: 24 mmol/L (ref 22–32)
Calcium: 8.7 mg/dL — ABNORMAL LOW (ref 8.9–10.3)
Chloride: 101 mmol/L (ref 98–111)
Creatinine, Ser: 1.2 mg/dL (ref 0.61–1.24)
GFR, Estimated: >60 mL/min (ref >60)
Glucose, Bld: 133 mg/dL — ABNORMAL HIGH (ref 70–99)
Potassium: 3 mmol/L — ABNORMAL LOW (ref 3.5–5.1)
Sodium: 139 mmol/L (ref 135–145)
Total Bilirubin: 0.9 mg/dL (ref 0.0–1.2)
Total Protein: 6.5 g/dL (ref 6.5–8.1)

## 2024-05-10 LAB — I-STAT CHEM 8, ED
BUN: 12 mg/dL (ref 6–20)
Calcium, Ion: 1.11 mmol/L — ABNORMAL LOW (ref 1.15–1.40)
Chloride: 101 mmol/L (ref 98–111)
Creatinine, Ser: 1.4 mg/dL — ABNORMAL HIGH (ref 0.61–1.24)
Glucose, Bld: 134 mg/dL — ABNORMAL HIGH (ref 70–99)
HCT: 39 % (ref 39.0–52.0)
Hemoglobin: 13.3 g/dL (ref 13.0–17.0)
Potassium: 3 mmol/L — ABNORMAL LOW (ref 3.5–5.1)
Sodium: 140 mmol/L (ref 135–145)
TCO2: 25 mmol/L (ref 22–32)

## 2024-05-10 LAB — ETHANOL: Alcohol, Ethyl (B): 80 mg/dL — ABNORMAL HIGH (ref <15)

## 2024-05-10 LAB — LIPASE, BLOOD: Lipase: 21 U/L (ref 11–51)

## 2024-05-10 MED ORDER — ONDANSETRON HCL 4 MG/2ML IJ SOLN
4.0000 mg | Freq: Once | INTRAMUSCULAR | Status: AC
  Administered 2024-05-10: 4 mg via INTRAVENOUS
  Filled 2024-05-10: qty 2

## 2024-05-10 MED ORDER — ONDANSETRON 4 MG PO TBDP: 4.0000 mg | ORAL_TABLET | Freq: Three times a day (TID) | ORAL | 0 refills | Status: AC | PRN

## 2024-05-10 MED ORDER — SODIUM CHLORIDE 0.9 % IV BOLUS
1000.0000 mL | Freq: Once | INTRAVENOUS | Status: AC
  Administered 2024-05-10: 1000 mL via INTRAVENOUS

## 2024-05-10 NOTE — ED Triage Notes (Signed)
 Pt BIB [facility] found in car unresponsive with emesis on arrival. Pt lethargic on arrival, and says he might have had too much alcohol today.   EMS VS: 75% RA on arrival; now  91% RA 142/62 108 HR CBG 127

## 2024-05-10 NOTE — ED Notes (Signed)
..  Patient is A&Ox4 upon discharge. Patient verbalized understanding of prescription and discharge instructions. Patient wheeled from ED, but ambulatory in room prior to discharge.

## 2024-05-10 NOTE — ED Notes (Signed)
 Patient transported to CT

## 2024-05-10 NOTE — ED Provider Notes (Signed)
 [place] EMERGENCY DEPARTMENT AT [facility] Provider Note   CSN: [id number] Arrival date & time: 05/10/24  2054     Patient presents with: unresponsive    Martin Nunez is a 38 y.o. male.   Patient here after being found in his vehicle unresponsive and with vomitus.  Patient admits to drinking tequila tonight heavily.  Denies hitting his head or losing consciousness.  Denies any other drug use.  He has a history of high cholesterol blood clot in the past.  History of trauma.  He denies any weakness numbness tingling.  No abdominal pain.  No shortness of breath.  He is feeling better after vomiting.  Denies any suicidal homicidal ideation.  Blood sugar was normal in the field.  No Narcan was given.  Initially oxygen levels were low but improved now.  The history is provided by the patient.       Prior to Admission medications   Medication Sig Start Date End Date Taking? Authorizing Provider  ondansetron  (ZOFRAN -ODT) 4 MG disintegrating tablet Take 1 tablet (4 mg total) by mouth every 8 (eight) hours as needed for nausea or vomiting. 05/10/24  Yes Shealyn Sean, DO  Multiple Vitamins-Minerals (MULTIVITAMIN WITH MINERALS) tablet Take 1 tablet by mouth daily.    [provider]    Allergies: Patient has no known allergies.    Review of Systems  Updated Vital Signs BP 125/67   Pulse 76   Temp 97.9 F (36.6 C) (Oral)   Resp 20   Ht 6' (1.829 m)   Wt 90.2 kg   SpO2 92%   BMI 26.97 kg/m   Physical Exam Vitals and nursing note reviewed.  Constitutional:      General: He is not in acute distress.    Appearance: He is well-developed. He is not ill-appearing.  HENT:     Head: Normocephalic and atraumatic.     Nose: Nose normal.     Mouth/Throat:     Mouth: Mucous membranes are moist.  Eyes:     Extraocular Movements: Extraocular movements intact.     Conjunctiva/sclera: Conjunctivae normal.     Pupils: Pupils are equal, round, and reactive to light.   Cardiovascular:     Rate and Rhythm: Normal rate and regular rhythm.     Pulses: Normal pulses.     Heart sounds: Normal heart sounds. No murmur heard. Pulmonary:     Effort: Pulmonary effort is normal. No respiratory distress.     Breath sounds: Normal breath sounds.  Abdominal:     General: Abdomen is flat.     Palpations: Abdomen is soft.     Tenderness: There is no abdominal tenderness.  Musculoskeletal:        General: No swelling.     Cervical back: Normal range of motion and neck supple.  Skin:    General: Skin is warm and dry.     Capillary Refill: Capillary refill takes less than 2 seconds.  Neurological:     General: No focal deficit present.     Mental Status: He is alert.     Comments: Normal strength and sensation throughout normal speech somnolent but pupils are equal and reactive  Psychiatric:        Mood and Affect: Mood normal.     (all labs ordered are listed, but only abnormal results are displayed) Labs Reviewed  ETHANOL - Abnormal; Notable for the following components:      Result Value   Alcohol, Ethyl (B)  80 (*)    All other components within normal limits  CBC WITH DIFFERENTIAL/PLATELET - Abnormal; Notable for the following components:   HCT 38.6 (*)    All other components within normal limits  COMPREHENSIVE METABOLIC PANEL WITH GFR - Abnormal; Notable for the following components:   Potassium 3.0 (*)    Glucose, Bld 133 (*)    Calcium  8.7 (*)    AST 54 (*)    All other components within normal limits  I-STAT CHEM 8, ED - Abnormal; Notable for the following components:   Potassium 3.0 (*)    Creatinine, Ser 1.40 (*)    Glucose, Bld 134 (*)    Calcium , Ion 1.11 (*)    All other components within normal limits  LIPASE, BLOOD  RAPID URINE DRUG SCREEN, HOSP PERFORMED    EKG: EKG Interpretation Date/Time:  Saturday May 10 2024 21:01:54 EDT Ventricular Rate:  77 PR Interval:  176 QRS Duration:  113 QT Interval:  396 QTC  Calculation: 449 R Axis:   59  Text Interpretation: Sinus rhythm LVH with IVCD and secondary repol abnrm Confirmed by Ruthe Cornet [phone]) on 05/10/2024 9:07:40 PM  Radiology: CT Head Wo Contrast Result Date: 05/10/2024 EXAM: CT HEAD AND CERVICAL SPINE 05/10/2024 09:50:00 PM TECHNIQUE: CT of the head and cervical spine was performed without the administration of intravenous contrast. Multiplanar reformatted images are provided for review. Automated exposure control, iterative reconstruction, and/or weight based adjustment of the mA/kV was utilized to reduce the radiation dose to as low as reasonably achievable. COMPARISON: CT head 05/08/2024. CLINICAL HISTORY: Polytrauma, blunt. Chief complaints; Unresponsive; CT Head Wo Contrast; Polytrauma, Blunt; CT Cervical Spine Wo Contrast; Polytrauma, Blunt; TRAUMA. FINDINGS: CT HEAD BRAIN AND VENTRICLES: No acute intracranial hemorrhage. No mass effect or midline shift. No abnormal extra-axial fluid collection. No evidence of acute infarct. No hydrocephalus. ORBITS: No acute abnormality. SINUSES AND MASTOIDS: No acute abnormality. SOFT TISSUES AND SKULL: No acute skull fracture. No acute soft tissue abnormality. CT CERVICAL SPINE BONES AND ALIGNMENT: No acute fracture or traumatic malalignment. DEGENERATIVE CHANGES: No significant degenerative changes. SOFT TISSUES: No prevertebral soft tissue swelling. IMPRESSION: 1. No acute intracranial abnormality. 2. No acute fracture or traumatic malalignment of the cervical spine. Electronically signed by: Morgane Naveau MD 05/10/2024 10:27 PM EDT RP Workstation: [id number]   CT Cervical Spine Wo Contrast Result Date: 05/10/2024 EXAM: CT HEAD AND CERVICAL SPINE 05/10/2024 09:50:00 PM TECHNIQUE: CT of the head and cervical spine was performed without the administration of intravenous contrast. Multiplanar reformatted images are provided for review. Automated exposure control, iterative reconstruction, and/or weight based  adjustment of the mA/kV was utilized to reduce the radiation dose to as low as reasonably achievable. COMPARISON: CT head 05/08/2024. CLINICAL HISTORY: Polytrauma, blunt. Chief complaints; Unresponsive; CT Head Wo Contrast; Polytrauma, Blunt; CT Cervical Spine Wo Contrast; Polytrauma, Blunt; TRAUMA. FINDINGS: CT HEAD BRAIN AND VENTRICLES: No acute intracranial hemorrhage. No mass effect or midline shift. No abnormal extra-axial fluid collection. No evidence of acute infarct. No hydrocephalus. ORBITS: No acute abnormality. SINUSES AND MASTOIDS: No acute abnormality. SOFT TISSUES AND SKULL: No acute skull fracture. No acute soft tissue abnormality. CT CERVICAL SPINE BONES AND ALIGNMENT: No acute fracture or traumatic malalignment. DEGENERATIVE CHANGES: No significant degenerative changes. SOFT TISSUES: No prevertebral soft tissue swelling. IMPRESSION: 1. No acute intracranial abnormality. 2. No acute fracture or traumatic malalignment of the cervical spine. Electronically signed by: Morgane Naveau MD 05/10/2024 10:27 PM EDT RP Workstation: [id number]   DG Chest Portable  1 View Result Date: 05/10/2024 CLINICAL DATA:  Lethargy, emesis EXAM: PORTABLE CHEST 1 VIEW COMPARISON:  03/09/2017 FINDINGS: Single frontal view of the chest demonstrates an unremarkable cardiac silhouette. No airspace disease, effusion, or pneumothorax. Gaseous distention of the stomach. No acute bony abnormality. IMPRESSION: 1. No acute intrathoracic process. Electronically Signed   By: Ozell Daring M.D.   On: 05/10/2024 22:10     Procedures   Medications Ordered in the ED  sodium chloride  0.9 % bolus 1,000 mL (1,000 mLs Intravenous New Bag/Given 05/10/24 2114)  ondansetron  (ZOFRAN ) injection 4 mg (4 mg Intravenous Given 05/10/24 2110)                                    Medical Decision Making Amount and/or Complexity of Data Reviewed Labs: ordered. Radiology: ordered.  Risk Prescription drug management.   Lynwood KATHEE Daring  is here with altered mental status.  Admits to alcohol use tonight.  Said he drank tequila pretty heavily.  Vomited in the car was found by EMS with vomit on him.  Concern for maybe aspiration.  He arrives here with normal vitals.  Oxygen is in the low 90s.  He is got overall clear breath sounds.  He denies any SI or HI.  Denies any trauma or falls.  Denies any other drug use.  Overall seems like symptoms secondary to alcohol intoxication.  EKG shows sinus rhythm.  Some T wave inversions laterally but unchanged from prior.  He was seen here 2 days ago for some agitation that resolved on its own.  Lab work was unremarkable at that time.  Will reevaluate after labs and imaging.  Overall lab work shows elevated alcohol.  But no significant leukocytosis anemia or electrolyte abnormality otherwise.  Chest x-ray with no aspiration and no pneumothorax.  No evidence of infection.  Head and neck CT were unremarkable.  Alcohol level is 80.  Overall he is clinically sober.  He is awake and alert.  Vital signs are reassuring.  He denies any SI or HI.  He is ambulatory.  Patient discharged in good condition.  Recommend follow-up with primary care.  Told to return if he has any other concerns.  Is not having any pain.  He is at his baseline.  I have no concern for other acute process.  This chart was dictated using voice recognition software.  Despite best efforts to proofread,  errors can occur which can change the documentation meaning.      Final diagnoses:  ETOHism (HCC)  Nausea and vomiting, unspecified vomiting type    ED Discharge Orders          Ordered    ondansetron  (ZOFRAN -ODT) 4 MG disintegrating tablet  Every 8 hours PRN        05/10/24 2255               Ruthe Cornet, DO 05/10/24 2255

## 2024-05-10 NOTE — ED Notes (Signed)
 Patient jewelry hand given to patient's friend Lavern that came to visit/pick-up patient. Patient aware and said it was okay to give it to her. Patient was able to ambulate prior to discharge with no assistance.
